# Patient Record
Sex: Female | Born: 1950 | Race: White | Hispanic: No | Smoking: Former smoker
Health system: Southern US, Community
[De-identification: ages and names within clinical notes are randomized; demographics above are authoritative.]

## PROBLEM LIST (undated history)

## (undated) DIAGNOSIS — I10 Essential (primary) hypertension: Secondary | ICD-10-CM

## (undated) DIAGNOSIS — K219 Gastro-esophageal reflux disease without esophagitis: Secondary | ICD-10-CM

## (undated) DIAGNOSIS — F419 Anxiety disorder, unspecified: Secondary | ICD-10-CM

## (undated) DIAGNOSIS — C44722 Squamous cell carcinoma of skin of right lower limb, including hip: Secondary | ICD-10-CM

## (undated) DIAGNOSIS — R51 Headache: Secondary | ICD-10-CM

## (undated) DIAGNOSIS — G43909 Migraine, unspecified, not intractable, without status migrainosus: Secondary | ICD-10-CM

## (undated) DIAGNOSIS — F32A Depression, unspecified: Secondary | ICD-10-CM

## (undated) DIAGNOSIS — R569 Unspecified convulsions: Secondary | ICD-10-CM

## (undated) DIAGNOSIS — K3184 Gastroparesis: Secondary | ICD-10-CM

## (undated) DIAGNOSIS — M1990 Unspecified osteoarthritis, unspecified site: Secondary | ICD-10-CM

## (undated) DIAGNOSIS — J42 Unspecified chronic bronchitis: Secondary | ICD-10-CM

## (undated) DIAGNOSIS — F329 Major depressive disorder, single episode, unspecified: Secondary | ICD-10-CM

## (undated) DIAGNOSIS — E119 Type 2 diabetes mellitus without complications: Secondary | ICD-10-CM

## (undated) DIAGNOSIS — F5104 Psychophysiologic insomnia: Secondary | ICD-10-CM

## (undated) DIAGNOSIS — C4442 Squamous cell carcinoma of skin of scalp and neck: Secondary | ICD-10-CM

## (undated) DIAGNOSIS — R519 Headache, unspecified: Secondary | ICD-10-CM

## (undated) DIAGNOSIS — R000 Tachycardia, unspecified: Secondary | ICD-10-CM

## (undated) HISTORY — PX: TONSILLECTOMY: SUR1361

## (undated) HISTORY — PX: INNER EAR SURGERY: SHX679

## (undated) HISTORY — PX: DILATION AND CURETTAGE OF UTERUS: SHX78

## (undated) HISTORY — PX: FRACTURE SURGERY: SHX138

## (undated) HISTORY — PX: SQUAMOUS CELL CARCINOMA EXCISION: SHX2433

---

## 1988-04-25 HISTORY — PX: REDUCTION MAMMAPLASTY: SUR839

## 1988-04-25 HISTORY — PX: WRIST FRACTURE SURGERY: SHX121

## 2016-02-10 ENCOUNTER — Encounter (HOSPITAL_COMMUNITY): Payer: Self-pay | Admitting: Emergency Medicine

## 2016-02-10 ENCOUNTER — Emergency Department (HOSPITAL_COMMUNITY): Payer: BLUE CROSS/BLUE SHIELD

## 2016-02-10 ENCOUNTER — Observation Stay (HOSPITAL_COMMUNITY)
Admission: EM | Admit: 2016-02-10 | Discharge: 2016-02-11 | Disposition: A | Discharge status: Home / Self Care | Payer: BLUE CROSS/BLUE SHIELD | Attending: Internal Medicine | Admitting: Internal Medicine

## 2016-02-10 ENCOUNTER — Observation Stay (HOSPITAL_COMMUNITY): Payer: BLUE CROSS/BLUE SHIELD

## 2016-02-10 DIAGNOSIS — N390 Urinary tract infection, site not specified: Secondary | ICD-10-CM

## 2016-02-10 DIAGNOSIS — K3184 Gastroparesis: Secondary | ICD-10-CM | POA: Diagnosis not present

## 2016-02-10 DIAGNOSIS — R112 Nausea with vomiting, unspecified: Secondary | ICD-10-CM

## 2016-02-10 DIAGNOSIS — Z79899 Other long term (current) drug therapy: Secondary | ICD-10-CM

## 2016-02-10 DIAGNOSIS — E8729 Other acidosis: Secondary | ICD-10-CM | POA: Insufficient documentation

## 2016-02-10 DIAGNOSIS — E1143 Type 2 diabetes mellitus with diabetic autonomic (poly)neuropathy: Secondary | ICD-10-CM | POA: Diagnosis not present

## 2016-02-10 DIAGNOSIS — I10 Essential (primary) hypertension: Secondary | ICD-10-CM | POA: Diagnosis not present

## 2016-02-10 DIAGNOSIS — Z882 Allergy status to sulfonamides status: Secondary | ICD-10-CM

## 2016-02-10 DIAGNOSIS — Z794 Long term (current) use of insulin: Secondary | ICD-10-CM

## 2016-02-10 DIAGNOSIS — Z8249 Family history of ischemic heart disease and other diseases of the circulatory system: Secondary | ICD-10-CM | POA: Insufficient documentation

## 2016-02-10 DIAGNOSIS — R000 Tachycardia, unspecified: Secondary | ICD-10-CM

## 2016-02-10 DIAGNOSIS — K5909 Other constipation: Secondary | ICD-10-CM

## 2016-02-10 DIAGNOSIS — E872 Acidosis: Secondary | ICD-10-CM

## 2016-02-10 DIAGNOSIS — B961 Klebsiella pneumoniae [K. pneumoniae] as the cause of diseases classified elsewhere: Secondary | ICD-10-CM | POA: Diagnosis not present

## 2016-02-10 DIAGNOSIS — E119 Type 2 diabetes mellitus without complications: Secondary | ICD-10-CM

## 2016-02-10 DIAGNOSIS — F419 Anxiety disorder, unspecified: Secondary | ICD-10-CM

## 2016-02-10 DIAGNOSIS — E785 Hyperlipidemia, unspecified: Secondary | ICD-10-CM

## 2016-02-10 HISTORY — DX: Anxiety disorder, unspecified: F41.9

## 2016-02-10 HISTORY — DX: Major depressive disorder, single episode, unspecified: F32.9

## 2016-02-10 HISTORY — DX: Gastroparesis: K31.84

## 2016-02-10 HISTORY — DX: Tachycardia, unspecified: R00.0

## 2016-02-10 HISTORY — DX: Headache: R51

## 2016-02-10 HISTORY — DX: Essential (primary) hypertension: I10

## 2016-02-10 HISTORY — DX: Unspecified convulsions: R56.9

## 2016-02-10 HISTORY — DX: Psychophysiologic insomnia: F51.04

## 2016-02-10 HISTORY — DX: Unspecified osteoarthritis, unspecified site: M19.90

## 2016-02-10 HISTORY — DX: Gastro-esophageal reflux disease without esophagitis: K21.9

## 2016-02-10 HISTORY — DX: Type 2 diabetes mellitus without complications: E11.9

## 2016-02-10 HISTORY — DX: Migraine, unspecified, not intractable, without status migrainosus: G43.909

## 2016-02-10 HISTORY — DX: Headache, unspecified: R51.9

## 2016-02-10 HISTORY — DX: Squamous cell carcinoma of skin of right lower limb, including hip: C44.722

## 2016-02-10 HISTORY — DX: Squamous cell carcinoma of skin of scalp and neck: C44.42

## 2016-02-10 HISTORY — DX: Depression, unspecified: F32.A

## 2016-02-10 HISTORY — DX: Unspecified chronic bronchitis: J42

## 2016-02-10 LAB — URINALYSIS, ROUTINE W REFLEX MICROSCOPIC
BILIRUBIN URINE: NEGATIVE
Glucose, UA: >1000 mg/dL — AB
NITRITE: NEGATIVE
PROTEIN: NEGATIVE mg/dL
Specific Gravity, Urine: 1.021 (ref 1.005–1.030)
pH: 5 (ref 5.0–8.0)

## 2016-02-10 LAB — GLUCOSE, CAPILLARY: Glucose-Capillary: 171 mg/dL — ABNORMAL HIGH (ref 65–99)

## 2016-02-10 LAB — BASIC METABOLIC PANEL
ANION GAP: 17 — AB (ref 5–15)
BUN: 15 mg/dL (ref 6–20)
CHLORIDE: 108 mmol/L (ref 101–111)
CO2: 15 mmol/L — AB (ref 22–32)
Calcium: 9.3 mg/dL (ref 8.9–10.3)
Creatinine, Ser: 0.86 mg/dL (ref 0.44–1.00)
GFR calc non Af Amer: >60 mL/min (ref >60)
Glucose, Bld: 191 mg/dL — ABNORMAL HIGH (ref 65–99)
Potassium: 4 mmol/L (ref 3.5–5.1)
Sodium: 140 mmol/L (ref 135–145)

## 2016-02-10 LAB — CBG MONITORING, ED: GLUCOSE-CAPILLARY: 104 mg/dL — AB (ref 65–99)

## 2016-02-10 LAB — COMPREHENSIVE METABOLIC PANEL
ALK PHOS: 62 U/L (ref 38–126)
ALT: 30 U/L (ref 14–54)
AST: 45 U/L — AB (ref 15–41)
Albumin: 4.7 g/dL (ref 3.5–5.0)
Anion gap: 17 — ABNORMAL HIGH (ref 5–15)
BILIRUBIN TOTAL: 1.1 mg/dL (ref 0.3–1.2)
BUN: 19 mg/dL (ref 6–20)
CALCIUM: 10.1 mg/dL (ref 8.9–10.3)
CO2: 17 mmol/L — ABNORMAL LOW (ref 22–32)
CREATININE: 0.8 mg/dL (ref 0.44–1.00)
Chloride: 106 mmol/L (ref 101–111)
GFR calc Af Amer: >60 mL/min (ref >60)
Glucose, Bld: 113 mg/dL — ABNORMAL HIGH (ref 65–99)
POTASSIUM: 4 mmol/L (ref 3.5–5.1)
Sodium: 140 mmol/L (ref 135–145)
TOTAL PROTEIN: 7.4 g/dL (ref 6.5–8.1)

## 2016-02-10 LAB — CBC
HCT: 42.5 % (ref 36.0–46.0)
Hemoglobin: 14.4 g/dL (ref 12.0–15.0)
MCH: 30.5 pg (ref 26.0–34.0)
MCHC: 33.9 g/dL (ref 30.0–36.0)
MCV: 90 fL (ref 78.0–100.0)
PLATELETS: 323 10*3/uL (ref 150–400)
RBC: 4.72 MIL/uL (ref 3.87–5.11)
RDW: 14.2 % (ref 11.5–15.5)
WBC: 18.4 10*3/uL — AB (ref 4.0–10.5)

## 2016-02-10 LAB — LIPASE, BLOOD: Lipase: 17 U/L (ref 11–51)

## 2016-02-10 LAB — URINE MICROSCOPIC-ADD ON: RBC / HPF: NONE SEEN RBC/hpf (ref 0–5)

## 2016-02-10 LAB — I-STAT CG4 LACTIC ACID, ED: LACTIC ACID, VENOUS: 0.99 mmol/L (ref 0.5–1.9)

## 2016-02-10 LAB — I-STAT TROPONIN, ED: Troponin i, poc: 0.01 ng/mL (ref 0.00–0.08)

## 2016-02-10 MED ORDER — METOPROLOL TARTRATE 5 MG/5ML IV SOLN: 5.0000 mg | Freq: Four times a day (QID) | INTRAVENOUS | Status: DC | PRN

## 2016-02-10 MED ORDER — ONDANSETRON HCL 4 MG/2ML IJ SOLN
4.0000 mg | Freq: Once | INTRAMUSCULAR | Status: AC | PRN
  Administered 2016-02-10: 4 mg via INTRAVENOUS
  Filled 2016-02-10: qty 2

## 2016-02-10 MED ORDER — METOCLOPRAMIDE HCL 5 MG/ML IJ SOLN
10.0000 mg | Freq: Once | INTRAMUSCULAR | Status: AC
  Administered 2016-02-10: 10 mg via INTRAVENOUS
  Filled 2016-02-10: qty 2

## 2016-02-10 MED ORDER — PROMETHAZINE HCL 25 MG/ML IJ SOLN
12.5000 mg | Freq: Once | INTRAMUSCULAR | Status: AC
Start: 2016-02-10 — End: 2016-02-10
  Administered 2016-02-10: 12.5 mg via INTRAVENOUS
  Filled 2016-02-10: qty 1

## 2016-02-10 MED ORDER — ENOXAPARIN SODIUM 40 MG/0.4ML ~~LOC~~ SOLN
40.0000 mg | SUBCUTANEOUS | Status: DC
  Administered 2016-02-10: 40 mg via SUBCUTANEOUS
  Filled 2016-02-10: qty 0.4

## 2016-02-10 MED ORDER — INSULIN ASPART 100 UNIT/ML ~~LOC~~ SOLN: 0.0000 [IU] | Freq: Every day | SUBCUTANEOUS | Status: DC

## 2016-02-10 MED ORDER — PROCHLORPERAZINE EDISYLATE 5 MG/ML IJ SOLN
5.0000 mg | Freq: Once | INTRAMUSCULAR | Status: AC
  Administered 2016-02-10: 5 mg via INTRAVENOUS
  Filled 2016-02-10: qty 2

## 2016-02-10 MED ORDER — SODIUM CHLORIDE 0.9 % IV BOLUS (SEPSIS)
500.0000 mL | Freq: Once | INTRAVENOUS | Status: AC
  Administered 2016-02-10: 500 mL via INTRAVENOUS

## 2016-02-10 MED ORDER — FAMOTIDINE IN NACL 20-0.9 MG/50ML-% IV SOLN
20.0000 mg | INTRAVENOUS | Status: DC
  Administered 2016-02-10: 20 mg via INTRAVENOUS
  Filled 2016-02-10 (×2): qty 50

## 2016-02-10 MED ORDER — INSULIN ASPART 100 UNIT/ML ~~LOC~~ SOLN
0.0000 [IU] | Freq: Three times a day (TID) | SUBCUTANEOUS | Status: DC
  Administered 2016-02-11: 2 [IU] via SUBCUTANEOUS
  Administered 2016-02-11: 1 [IU] via SUBCUTANEOUS

## 2016-02-10 MED ORDER — DEXTROSE 5 % IV SOLN
1.0000 g | INTRAVENOUS | Status: DC
  Administered 2016-02-10 – 2016-02-11 (×2): 1 g via INTRAVENOUS
  Filled 2016-02-10 (×2): qty 10

## 2016-02-10 MED ORDER — SODIUM CHLORIDE 0.9 % IV SOLN
INTRAVENOUS | Status: AC
  Administered 2016-02-10 – 2016-02-11 (×2): via INTRAVENOUS

## 2016-02-10 MED ORDER — SODIUM CHLORIDE 0.9% FLUSH
3.0000 mL | Freq: Two times a day (BID) | INTRAVENOUS | Status: DC
  Administered 2016-02-10 – 2016-02-11 (×2): 3 mL via INTRAVENOUS

## 2016-02-10 MED ORDER — METOCLOPRAMIDE HCL 5 MG/ML IJ SOLN
5.0000 mg | Freq: Four times a day (QID) | INTRAMUSCULAR | Status: DC | PRN
  Administered 2016-02-11: 5 mg via INTRAVENOUS
  Filled 2016-02-10: qty 2

## 2016-02-10 MED ORDER — MORPHINE SULFATE (PF) 2 MG/ML IV SOLN
2.0000 mg | Freq: Once | INTRAVENOUS | Status: AC
  Administered 2016-02-10: 2 mg via INTRAVENOUS
  Filled 2016-02-10: qty 1

## 2016-02-10 MED ORDER — GI COCKTAIL ~~LOC~~
30.0000 mL | Freq: Two times a day (BID) | ORAL | Status: DC | PRN
  Filled 2016-02-10: qty 30

## 2016-02-10 MED ORDER — DEXTROSE-NACL 5-0.45 % IV SOLN
INTRAVENOUS | Status: DC
  Administered 2016-02-10: 16:00:00 via INTRAVENOUS

## 2016-02-10 NOTE — ED Notes (Signed)
Informed pt of need for urine sample. Pt advised that she would call us when she was ready to give one.

## 2016-02-10 NOTE — ED Triage Notes (Signed)
Patient complains of epigastric pain with vomiting since last pm. States that she thinks related to her gastroparesis. Patient appears anxious on arrival and states that she did drink wine last pm at concert.  Alert and oriented

## 2016-02-10 NOTE — ED Notes (Signed)
Attempted report x1. 

## 2016-02-10 NOTE — Progress Notes (Signed)
Brandy Palmer [id number] Admission Data: 02/10/2016 6:54 PM Attending Provider: Sid Falcon, MD  PCP:No primary care provider on file. Consults/ Treatment Team:   Brandy Palmer is a 65 y.o. female patient admitted from ED awake, alert  & orientated  X 3,  Full Code, VSS - Blood pressure 138/69, pulse (!) 124, temperature 98 F (36.7 C), temperature source Oral, resp. rate 19, SpO2 96 %.,   no c/o shortness of breath, no c/o chest pain, no distress noted. Tele # 10  placed and pt is currently running:normal sinus rhythm.   IV site WDL:  forearm right, condition patent and no redness with a transparent dsg that's clean dry and intact.  Allergies:   Allergies  Allergen Reactions  . Bactrim [Sulfamethoxazole-Trimethoprim] Nausea And Vomiting  . Sulfa Antibiotics Nausea And Vomiting     Past Medical History:  Diagnosis Date  . Diabetes mellitus without complication ([place])   . Gastroparesis   . Hypertension   . Seizures ([place])     History:  obtained from the patient. Tobacco/alcohol: denied social drinker  Pt orientation to unit, room and routine. Information packet given to patient/family and safety video watched.  Admission INP armband ID verified with patient/family, and in place. SR up x 2, fall risk assessment complete with Patient and family verbalizing understanding of risks associated with falls. Pt verbalizes an understanding of how to use the call bell and to call for help before getting out of bed.  Skin, clean-dry- intact without evidence of bruising, or skin tears.   No evidence of skin break down noted on exam. no rashes, no ecchymoses, no petechiae    Will cont to monitor and assist as needed.  Brandy Varone Margaretha Sheffield, RN 02/10/2016 6:54 PM

## 2016-02-10 NOTE — H&P (Signed)
Date: 02/10/2016               Patient Name:  Brandy Palmer MRN: [id number]  DOB: December 13, 1950 Age / Sex: 65 y.o., female   PCP: No primary care provider on file.         Medical Service: Internal Medicine Teaching Service         Attending Physician: Dr. Sid Falcon, MD    First Contact: Dr. Reesa Chew Pager: [phone]  Second Contact: Dr. Juleen China Pager: [phone]       After Hours (After 5p/  First Contact Pager: [phone]  weekends / holidays): Second Contact Pager: [phone]   Chief Complaint: Nausea vomiting with abdominal pain.  History of Present Illness: Brandy Palmer,65 y.o lady with PMHOf diabetes complicated with gastroparesis, hypertension and tachycardia, came to the ED with complaints of nausea or vomiting and abdominal pain started around 4 AM this morning.  Patient states that she was visiting from [place], they were about to go back home today. She states that she started getting this feeling of tightness accompanied with nausea and vomiting early this morning. She states that she was drinking yesterday evening, when she went to bed, she was little uncomfortable. Later on she developed tightness in her epigastrium, and then developed nausea and vomiting. She is unable to keep any by mouth intake. She states that she do get these type of episodes because of her history of gastroparesis. She was attributing that alcohol intake yesterday might be a contributory factor. Her epigastric pain is more like a tightness with some intermittent cramps, non radiating.No exacerbating or relieving factors. She had numerous episodes of emesis of yellow fluid, denies any hematemesis or coffee ground vomitus. She do endorse some subjective fever and chills. She also complained of palpitations, she had an history of being tachycardic and has been worked up in the past by cardiologist. Her rate is controlled on metoprolol. She denies any chest pain, orthopnea or PND. She is an history of getting  seizures with increase in vomiting. She was recently diagnosed with a urinary tract infection and did not get antibiotics for it. She reports dysuria and foul smelling urine.  She also has an history of chronic constipation, states that her last bowel movement was last week.  Meds:  Current Meds  Medication Sig  . ALPRAZolam (XANAX) 0.5 MG tablet Take 0.5-1 mg by mouth 2 (two) times daily as needed for anxiety.  . Aspirin-Salicylamide-Caffeine (BC HEADACHE POWDER PO) Take 1 packet by mouth daily as needed (headache).  . canagliflozin (INVOKANA) 100 MG TABS tablet Take 100 mg by mouth daily before breakfast.  . FLUoxetine (PROZAC) 20 MG capsule Take 20 mg by mouth daily.  . Insulin Glargine (LANTUS SOLOSTAR) 100 UNIT/ML Solostar Pen Inject 35-40 Units into the skin daily at 10 pm. Per sliding scale  . linagliptin (TRADJENTA) 5 MG TABS tablet Take 5 mg by mouth daily.  Marland Kitchen omeprazole (PRILOSEC) 20 MG capsule Take 20 mg by mouth daily.  . promethazine (PHENERGAN) 12.5 MG tablet Take 12.5 mg by mouth every 8 (eight) hours as needed for nausea or vomiting.  . valACYclovir (VALTREX) 500 MG tablet Take 500 mg by mouth daily.  Marland Kitchen zolpidem (AMBIEN) 10 MG tablet Take 5-10 mg by mouth at bedtime as needed for sleep.     Allergies: Allergies as of 02/10/2016 - Review Complete 02/10/2016  Allergen Reaction Noted  . Bactrim [sulfamethoxazole-trimethoprim] Nausea And Vomiting 02/10/2016  . Sulfa antibiotics Nausea And  Vomiting 02/10/2016   Past Medical History:  Diagnosis Date  . Diabetes mellitus without complication ([place])   . Gastroparesis   . Hypertension   . Seizures ([place])     Family History: One brother has hypertension and the other one has diabetes.  Social History: Nonsmoker, occasionally using alcohol. Denies any illicit drug use.  Review of Systems: A complete ROS was negative except as per HPI.   Physical Exam: Blood pressure 135/68, pulse (!) 121, temperature 98.2 F (36.8 C),  temperature source Oral, resp. rate 18, SpO2 98 %.  Vitals:   02/10/16 1630 02/10/16 1700 02/10/16 1730 02/10/16 1800  BP: 143/67 138/57 142/69 135/68  Pulse: (!) 125 (!) 123 (!) 123 (!) 121  Resp: 14 19 18 18   Temp:      TempSrc:      SpO2: 96% 96% 96% 98%   General: Vital signs reviewed.  Patient is well-developed and well-nourished, in no acute distress and cooperative with exam.  Head: Normocephalic and atraumatic. Eyes: EOMI, conjunctivae normal, no scleral icterus.  Neck: Supple, trachea midline, normal ROM, no JVD, masses, thyromegaly, or carotid bruit present.  Cardiovascular: Tachycardia,no murmurs, gallops, or rubs. Pulmonary/Chest: Clear to auscultation bilaterally, no wheezes, rales, or rhonchi. Abdominal: Soft, Epigastric tenderness, non-distended, BS +, no masses, organomegaly, or guarding present.  Extremities: No lower extremity edema bilaterally,  pulses symmetric and intact bilaterally. No cyanosis or clubbing. Neurological: A&O x3, Strength is normal and symmetric bilaterally, cranial nerve II-XII are grossly intact, no focal motor deficit, sensory intact to light touch bilaterally.  Skin: Warm, dry and intact. No rashes or erythema.  Labs. CBC Latest Ref Rng & Units 02/10/2016  WBC 4.0 - 10.5 K/uL 18.4(H)  Hemoglobin 12.0 - 15.0 g/dL 14.4  Hematocrit 36.0 - 46.0 % 42.5  Platelets 150 - 400 K/uL 323   CMP Latest Ref Rng & Units 02/10/2016  Glucose 65 - 99 mg/dL 113(H)  BUN 6 - 20 mg/dL 19  Creatinine 0.44 - 1.00 mg/dL 0.80  Sodium 135 - 145 mmol/L 140  Potassium 3.5 - 5.1 mmol/L 4.0  Chloride 101 - 111 mmol/L 106  CO2 22 - 32 mmol/L 17(L)  Calcium 8.9 - 10.3 mg/dL 10.1  Total Protein 6.5 - 8.1 g/dL 7.4  Total Bilirubin 0.3 - 1.2 mg/dL 1.1  Alkaline Phos 38 - 126 U/L 62  AST 15 - 41 U/L 45(H)  ALT 14 - 54 U/L 30   Urinalysis    Component Value Date/Time   COLORURINE AMBER (A) 02/10/2016 1300   APPEARANCEUR CLEAR 02/10/2016 1300   LABSPEC 1.021  02/10/2016 1300   PHURINE 5.0 02/10/2016 1300   GLUCOSEU >1000 (A) 02/10/2016 1300   HGBUR SMALL (A) 02/10/2016 1300   BILIRUBINUR NEGATIVE 02/10/2016 1300   KETONESUR >80 (A) 02/10/2016 1300   PROTEINUR NEGATIVE 02/10/2016 1300   NITRITE NEGATIVE 02/10/2016 1300   LEUKOCYTESUR TRACE (A) 02/10/2016 1300   Squamous Epithelial / LPF NONE SEEN 0-5    WBC, UA 0 - 5 WBC/hpf 6-30   RBC / HPF 0 - 5 RBC/hpf NONE SEEN   Bacteria, UA NONE SEEN RARE    Casts NEGATIVE HYALINE CASTS     Urine culture and sensitivity from October 11.  Klebsiella pneumoniae (A) Comment:  Greater than 100,000 colony forming units per mL Cefazolin <=4 ug/mL Cefazolin with an MIC <=16 predicts susceptibility to the oral agents cefaclor, cefdinir, cefpodoxime, cefprozil, cefuroxime, cephalexin, and loracarbef when used for therapy of uncomplicated urinary tract infections due to  E. coli, Klebsiella pneumoniae, and Proteus mirabilis.    Antimicrobial Susceptibility Comment Comment:  ** S = Susceptible; I = Intermediate; R = Resistant **  P = Positive; N = Negative MICS are expressed in micrograms per mL  Antibiotic RSLT#1RSLT#2RSLT#3RSLT#4 Amoxicillin/Clavulanic AcidS Ampicillin R Cefepime S CeftriaxoneS Cefuroxime S CephalothinS CiprofloxacinS ErtapenemS Gentamicin S Imipenem S Levofloxacin S Nitrofurantoin R Piperacillin R Tetracycline S Tobramycin S Trimethoprim/Sulfa S        EKG: Sinus tachycardia Atrial premature complex Borderline right axis deviation Borderline prolonged QT interval  CXR:  FINDINGS: The heart size and mediastinal  contours are normal. The lungs are clear. There is no pleural effusion or pneumothorax. No acute osseous findings are identified. EKG snaps overlie the chest bilaterally. There are mild thoracic spine degenerative changes.  IMPRESSION: No active cardiopulmonary process.   Assessment & Plan by Problem: Ms. Pintor,65 y.o lady with PMHOf diabetes complicated with gastroparesis, hypertension and tachycardia, came to the ED with complaints of nausea or vomiting and abdominal pain.  Nausea vomiting with epigastric pain. She has an history of type 2 diabetes with gastroparesis diagnosed about a year ago with gastric emptying studies. Differential include Exacerbation of her existing gastroparesis. She told us that she do get this type of episode of vomiting with epigastric discomfort because of her gastroparesis. In the ED she was found to have metabolic acidosis with anion gap of 17 and bicarbonate of 17. Her urine was positive with ketones. Lactic acid was normal. Her blood sugar was 113, which makes DKA unlikely. Most probably starvation ketosis. Acute gastritis. Can be another possibility, as her symptoms started after drinking wine more than her usual amount. -Admit to MedSurg for observation. -Dextrose 5% with normal saline infusion at 125 mL per hour. -GI cocktail -Pepcid 20 mg IV daily -Reglan 10 mg IV every 6H when necessary. -Abdominal x-rays to rule out any possible obstruction, as her last bowel movement was 1 week ago. UTI. She has a recent diagnosis of UTI, with a culture sensitivity report on October 11. She was given an antibiotic but she never picked it up from pharmacy. Her UA look dirty. Culture were sent. She was started on Rocephin in the ED, her previous culture sensitivity shows Klebsiella pneumonia sensitive to ceftriaxone. She can be started on either Cipro or Augmentin tomorrow.  DM. She has well-controlled diabetes, her last A1c was 6.4. She was found on Invokana and  Lantus at home. -SSI.  Hyperlipidemia. She is on simvastatin 20 mg daily and fenofibrate. We are holding her home medicines for hyperlipidemia because of her continuous nausea and vomiting.  Hypertension. She was on lisinopril 10 mg daily. -We will hold her lisinopril and reassess her blood pressure in the morning.  Sinus Tachycardia. She had this history of tachycardia which was been worked up in the past by cardiologist. She is on Toprol 25 mg daily for rate control. -Metoprolol 5 mg IV every 6 hourly when necessary for the heart rate more than 130.  CODE STATUS. Full Diet. Nothing by mouth DVT prophylaxis. Lovenox  Dispo: Admit patient to Observation with expected length of stay less than 2 midnights.  Signed: [name], MD 02/10/2016, 6:23 PM  Pager: [phone]

## 2016-02-10 NOTE — ED Notes (Signed)
Pt complains of having chills.

## 2016-02-10 NOTE — ED Provider Notes (Signed)
[facility] DEPT Provider Note   CSN: [id number] Arrival date & time: 02/10/16  1027     History   Chief Complaint Chief Complaint  Patient presents with  . Abdominal Pain  . Emesis    HPI Brandy Palmer is a 65 year old woman with DM2, gastroparesis, HTN, seizures.  HPI She presents with abdominal pain, nausea, vomiting. Her symptoms started last night. Her abdominal pain is in the epigastric region. It is cramping in nature and comes and goes. Does not radiate anywhere. She has associated chest pain. No exacerbating or relieving factors. She has had numerous episodes of emesis that she says is yellow fluid. No hematemesis or coffee ground emesis. This is similar to previous episodes of gastroparesis flares. Only thing that she has had recently that was unusual for her was a glass of wine. She did not take her insulin last night since she had the wine. She reports subjective fevers and chills. Her last BM was a week ago but she has been passing flatus - last this morning.   She is visiting from [place]. She reports her gastroparesis was diagnosed with a gastric emptying study years ago. She has also had diabetes since her 108s.  She was recently diagnosed with a urinary tract infection and did not get antibiotics for it. She reports dysuria and foul smelling urine.   Past Medical History:  Diagnosis Date  . Diabetes mellitus without complication ([place])   . Gastroparesis   . Hypertension   . Seizures ([place])     History reviewed. No pertinent surgical history.  OB History    No data available       Home Medications    Prior to Admission medications   Medication Sig Start Date End Date Taking? Authorizing Provider  ALPRAZolam Duanne Moron) 0.5 MG tablet Take 0.5-1 mg by mouth 2 (two) times daily as needed for anxiety.   Yes Historical Provider, MD  Aspirin-Salicylamide-Caffeine (BC HEADACHE POWDER PO) Take 1 packet by mouth daily as needed (headache).   Yes Historical  Provider, MD  canagliflozin (INVOKANA) 100 MG TABS tablet Take 100 mg by mouth daily before breakfast.   Yes Historical Provider, MD  FLUoxetine (PROZAC) 20 MG capsule Take 20 mg by mouth daily.   Yes Historical Provider, MD  Insulin Glargine (LANTUS SOLOSTAR) 100 UNIT/ML Solostar Pen Inject 35-40 Units into the skin daily at 10 pm. Per sliding scale   Yes Historical Provider, MD  linagliptin (TRADJENTA) 5 MG TABS tablet Take 5 mg by mouth daily.   Yes Historical Provider, MD  omeprazole (PRILOSEC) 20 MG capsule Take 20 mg by mouth daily.   Yes Historical Provider, MD  promethazine (PHENERGAN) 12.5 MG tablet Take 12.5 mg by mouth every 8 (eight) hours as needed for nausea or vomiting.   Yes Historical Provider, MD  valACYclovir (VALTREX) 500 MG tablet Take 500 mg by mouth daily.   Yes Historical Provider, MD  zolpidem (AMBIEN) 10 MG tablet Take 5-10 mg by mouth at bedtime as needed for sleep.   Yes Historical Provider, MD    Family History No family history on file.  Social History Social History  Substance Use Topics  . Smoking status: Never Smoker  . Smokeless tobacco: Never Used  . Alcohol use Yes     Allergies   Bactrim [sulfamethoxazole-trimethoprim] and Sulfa antibiotics   Review of Systems Review of Systems Constitutional: +subjective fevers/chills Eyes: no vision changes Ears, nose, mouth, throat, and face: no cough Respiratory: no shortness of breath Cardiovascular: +  chest pain Gastrointestinal: +nausea/vomiting, +abdominal pain, no constipation, no diarrhea Genitourinary: +dysuria, no hematuria Integument: no rash Hematologic/lymphatic: no bleeding/bruising, no edema Musculoskeletal: no arthralgias, no myalgias Neurological: no paresthesias, no weakness   Physical Exam Updated Vital Signs BP (!) 118/47   Pulse 118   Temp 98.2 F (36.8 C) (Oral)   Resp 19   SpO2 96%   Physical Exam General Apperance: Mild distress Head: Normocephalic, atraumatic Eyes:  PERRL, EOMI, anicteric sclera Ears: Normal external ear canal Nose: Nares normal, septum midline, mucosa normal Throat: Lips, mucosa and tongue normal  Neck: Supple, trachea midline Back: No tenderness or bony abnormality  Lungs: Clear to auscultation bilaterally. No wheezes, rhonchi or rales. Breathing comfortably Chest Wall: Nontender, no deformity Heart: Tachycardic rate and regular rhythm, no murmur/rub/gallop Abdomen: Soft, tender to palpation in epigastric region, nondistended, no rebound/guarding Extremities: Normal, atraumatic, warm and well perfused, no edema Pulses: 2+ throughout Skin: No rashes or lesions Neurologic: Alert and oriented x 3. CNII-XII intact. Normal strength and sensation  ED Treatments / Results  Labs (all labs ordered are listed, but only abnormal results are displayed) Labs Reviewed  COMPREHENSIVE METABOLIC PANEL - Abnormal; Notable for the following:       Result Value   CO2 17 (*)    Glucose, Bld 113 (*)    AST 45 (*)    Anion gap 17 (*)    All other components within normal limits  CBC - Abnormal; Notable for the following:    WBC 18.4 (*)    All other components within normal limits  URINALYSIS, ROUTINE W REFLEX MICROSCOPIC (NOT AT [facility]) - Abnormal; Notable for the following:    Color, Urine AMBER (*)    Glucose, UA >1000 (*)    Hgb urine dipstick SMALL (*)    Ketones, ur >80 (*)    Leukocytes, UA TRACE (*)    All other components within normal limits  URINE MICROSCOPIC-ADD ON - Abnormal; Notable for the following:    Squamous Epithelial / LPF 0-5 (*)    Bacteria, UA RARE (*)    Casts HYALINE CASTS (*)    All other components within normal limits  CBG MONITORING, ED - Abnormal; Notable for the following:    Glucose-Capillary 104 (*)    All other components within normal limits  URINE CULTURE  LIPASE, BLOOD  I-STAT TROPOININ, ED  I-STAT CG4 LACTIC ACID, ED    EKG  EKG Interpretation  Date/Time:  Wednesday February 10 2016 10:57:16  EDT Ventricular Rate:  99 PR Interval:    QRS Duration: 91 QT Interval:  385 QTC Calculation: 495 R Axis:   87 Text Interpretation:  Sinus tachycardia Atrial premature complex Borderline right axis deviation Borderline prolonged QT interval No previous tracing Confirmed by Maryan Rued  MD, Loree Fee ([id number]) on 02/10/2016 11:03:17 AM Also confirmed by Maryan Rued  MD, Loree Fee ([id number]), editor [place], Joelene Millin [phone])  on 02/10/2016 1:50:07 PM       Radiology Dg Chest 2 View  Result Date: 02/10/2016 CLINICAL DATA:  Chest tightness and nausea for 4 days. EXAM: CHEST  2 VIEW COMPARISON:  None. FINDINGS: The heart size and mediastinal contours are normal. The lungs are clear. There is no pleural effusion or pneumothorax. No acute osseous findings are identified. EKG snaps overlie the chest bilaterally. There are mild thoracic spine degenerative changes. IMPRESSION: No active cardiopulmonary process. Electronically Signed   By: Richardean Sale M.D.   On: 02/10/2016 13:19    Procedures   Medications Ordered in  ED Medications  dextrose 5 %-0.45 % sodium chloride infusion (not administered)  cefTRIAXone (ROCEPHIN) 1 g in dextrose 5 % 50 mL IVPB (1 g Intravenous New Bag/Given 02/10/16 1443)  ondansetron (ZOFRAN) injection 4 mg (4 mg Intravenous Given 02/10/16 1048)  sodium chloride 0.9 % bolus 500 mL (0 mLs Intravenous Stopped 02/10/16 1150)  metoCLOPramide (REGLAN) injection 10 mg (10 mg Intravenous Given 02/10/16 1147)  morphine 2 MG/ML injection 2 mg (2 mg Intravenous Given 02/10/16 1149)  sodium chloride 0.9 % bolus 500 mL (500 mLs Intravenous New Bag/Given 02/10/16 1156)    Initial Impression / Assessment and Plan / ED Course  I have reviewed the triage vital signs and the nursing notes.  Pertinent labs & imaging results that were available during my care of the patient were reviewed by me and considered in my medical decision making (see chart for details).  Clinical Course   11:00am  evaluated pt 14:00 mild improvement in symptoms  Final Clinical Impressions(s) / ED Diagnoses   Final diagnoses:  Gastroparesis  Starvation ketoacidosis  Emesis and epigastric pain since yesterday evening. She has a history of DM2 with gastroparesis. Metabolic acidosis with anion gap - bicarb 17 and anion gap of 17. Lactic acid normal. Troponin POC 0.01 and EKG without acute ischemic changes. Nonspecific T wave changes. QTc 495. Her WBC is 18.4. CXR with no acute abnormality. UA with ketones. She has trace leukocytes, 6-30 WBC, rate bacteria, 0-5 squamous epithelial cells. Unlikely to be in DKA given her blood glucose of 113. Her metabolic acidosis with anion gap and ketones in the urine are probably from a starvation ketosis secondary to gastroparesis flare. Possible UTI. Added on urine culture. Will cover with ceftriaxone for now. Admit for observation to med surg. Discussed with IMTS.  New Prescriptions New Prescriptions   No medications on file     Milagros Loll, MD 02/10/16 1517    Blanchie Dessert, MD 02/11/16 2221

## 2016-02-11 DIAGNOSIS — N390 Urinary tract infection, site not specified: Secondary | ICD-10-CM | POA: Diagnosis not present

## 2016-02-11 DIAGNOSIS — R112 Nausea with vomiting, unspecified: Secondary | ICD-10-CM

## 2016-02-11 DIAGNOSIS — B961 Klebsiella pneumoniae [K. pneumoniae] as the cause of diseases classified elsewhere: Secondary | ICD-10-CM | POA: Diagnosis not present

## 2016-02-11 DIAGNOSIS — K3184 Gastroparesis: Secondary | ICD-10-CM | POA: Diagnosis not present

## 2016-02-11 DIAGNOSIS — E1143 Type 2 diabetes mellitus with diabetic autonomic (poly)neuropathy: Secondary | ICD-10-CM | POA: Diagnosis not present

## 2016-02-11 LAB — CBC
HEMATOCRIT: 36.6 % (ref 36.0–46.0)
HEMOGLOBIN: 12 g/dL (ref 12.0–15.0)
MCH: 30.3 pg (ref 26.0–34.0)
MCHC: 32.8 g/dL (ref 30.0–36.0)
MCV: 92.4 fL (ref 78.0–100.0)
Platelets: 273 10*3/uL (ref 150–400)
RBC: 3.96 MIL/uL (ref 3.87–5.11)
RDW: 15 % (ref 11.5–15.5)
WBC: 14.7 10*3/uL — ABNORMAL HIGH (ref 4.0–10.5)

## 2016-02-11 LAB — BASIC METABOLIC PANEL
ANION GAP: 14 (ref 5–15)
ANION GAP: 11 (ref 5–15)
BUN: 18 mg/dL (ref 6–20)
BUN: 16 mg/dL (ref 6–20)
CHLORIDE: 111 mmol/L (ref 101–111)
CO2: 14 mmol/L — AB (ref 22–32)
CO2: 16 mmol/L — ABNORMAL LOW (ref 22–32)
Calcium: 8.9 mg/dL (ref 8.9–10.3)
Calcium: 9 mg/dL (ref 8.9–10.3)
Chloride: 111 mmol/L (ref 101–111)
Creatinine, Ser: 0.82 mg/dL (ref 0.44–1.00)
Creatinine, Ser: 0.79 mg/dL (ref 0.44–1.00)
GFR calc Af Amer: >60 mL/min (ref >60)
GFR calc Af Amer: >60 mL/min (ref >60)
GLUCOSE: 134 mg/dL — AB (ref 65–99)
GLUCOSE: 153 mg/dL — AB (ref 65–99)
POTASSIUM: 4 mmol/L (ref 3.5–5.1)
POTASSIUM: 4.1 mmol/L (ref 3.5–5.1)
Sodium: 139 mmol/L (ref 135–145)
Sodium: 138 mmol/L (ref 135–145)

## 2016-02-11 LAB — GLUCOSE, CAPILLARY
GLUCOSE-CAPILLARY: 137 mg/dL — AB (ref 65–99)
GLUCOSE-CAPILLARY: 179 mg/dL — AB (ref 65–99)

## 2016-02-11 LAB — HIV ANTIBODY (ROUTINE TESTING W REFLEX): HIV Screen 4th Generation wRfx: NONREACTIVE

## 2016-02-11 MED ORDER — AMOXICILLIN-POT CLAVULANATE 500-125 MG PO TABS: 500.0000 mg | ORAL_TABLET | Freq: Two times a day (BID) | ORAL | 0 refills | Status: AC

## 2016-02-11 MED ORDER — PROMETHAZINE HCL 25 MG/ML IJ SOLN: 12.5000 mg | INTRAMUSCULAR | Status: DC | PRN

## 2016-02-11 MED ORDER — PROMETHAZINE HCL 25 MG/ML IJ SOLN: 12.5000 mg | Freq: Four times a day (QID) | INTRAMUSCULAR | Status: DC | PRN

## 2016-02-11 MED ORDER — AMOXICILLIN-POT CLAVULANATE 500-125 MG PO TABS: 500.0000 mg | ORAL_TABLET | Freq: Two times a day (BID) | ORAL | 0 refills | Status: DC

## 2016-02-11 MED ORDER — METOCLOPRAMIDE HCL 5 MG/ML IJ SOLN
5.0000 mg | Freq: Four times a day (QID) | INTRAMUSCULAR | Status: DC
  Administered 2016-02-11: 5 mg via INTRAVENOUS
  Filled 2016-02-11: qty 2

## 2016-02-11 MED ORDER — CIPROFLOXACIN-CIPROFLOX HCL ER 500 MG PO TB24: 500.0000 mg | ORAL_TABLET | Freq: Every day | ORAL | 0 refills | Status: DC

## 2016-02-11 MED ORDER — SODIUM CHLORIDE 0.9 % IV SOLN
INTRAVENOUS | Status: DC
  Administered 2016-02-11 (×2): via INTRAVENOUS

## 2016-02-11 NOTE — Care Management Note (Signed)
Case Management Note  Patient Details  Name: KEYANNI AVEY MRN: [id number] Date of Birth: [birth year]  Subjective/Objective:                 Patient from home in [place]. In town for [employer]. In obs for gastroparesis patient states is related to DM. Patient is independent, retired, and drives. Patient denies need for CM resources.  PCP Dr Henrietta Dine   Action/Plan:  Anticipate DC to home self care as symptoms resolve.  Expected Discharge Date:                  Expected Discharge Plan:  Home/Self Care  In-House Referral:  NA  Discharge planning Services  CM Consult  Post Acute Care Choice:  NA Choice offered to:  NA  DME Arranged:  N/A DME Agency:  NA  HH Arranged:  NA HH Agency:  NA  Status of Service:  Completed, signed off  If discussed at [place] of Stay Meetings, dates discussed:    Additional Comments:  Carles Collet, RN 02/11/2016, 2:26 PM

## 2016-02-11 NOTE — Discharge Summary (Signed)
Name: Brandy Palmer MRN: [id number] DOB: Sep 25, 1950 65 y.o. PCP: Pcp Not In System  Date of Admission: 02/10/2016 10:40 AM Date of Discharge: 02/12/2016 Attending Physician: No att. providers found  Discharge Diagnosis: 1. Gastroparesis 2. UTI 3. Diabetes 4. Hypertension 5. Hyperlipidemia 6. Sinus tachycardia  Discharge Medications:   Medication List    STOP taking these medications   BC HEADACHE POWDER PO     TAKE these medications   ALPRAZolam 0.5 MG tablet Commonly known as:  XANAX Take 0.5-1 mg by mouth 2 (two) times daily as needed for anxiety.   amoxicillin-clavulanate 500-125 MG tablet Commonly known as:  AUGMENTIN Take 1 tablet (500 mg total) by mouth 2 (two) times daily. Stop date 10/22   FLUoxetine 20 MG capsule Commonly known as:  PROZAC Take 20 mg by mouth daily.   INVOKANA 100 MG Tabs tablet Generic drug:  canagliflozin Take 100 mg by mouth daily before breakfast.   LANTUS SOLOSTAR 100 UNIT/ML Solostar Pen Generic drug:  Insulin Glargine Inject 35-40 Units into the skin daily at 10 pm. Per sliding scale   linagliptin 5 MG Tabs tablet Commonly known as:  TRADJENTA Take 5 mg by mouth daily.   omeprazole 20 MG capsule Commonly known as:  PRILOSEC Take 20 mg by mouth daily.   promethazine 12.5 MG tablet Commonly known as:  PHENERGAN Take 12.5 mg by mouth every 8 (eight) hours as needed for nausea or vomiting.   valACYclovir 500 MG tablet Commonly known as:  VALTREX Take 500 mg by mouth daily.   zolpidem 10 MG tablet Commonly known as:  AMBIEN Take 5-10 mg by mouth at bedtime as needed for sleep.       Disposition and follow-up:   BrandyBrandy Palmer was discharged from [facility] in Stable condition.  At the hospital follow up visit please address:  1.  Her need to cut back on her alcohol intake, and education about her gastroparesis.  2.  Labs / imaging needed at time of follow-up: UA  3.  Pending labs/ test  needing follow-up: None  Follow-up Appointments:  please call your PCP to make a follow-up appointment.  Hospital Course by problem list:  Brandy Palmer,65 y.o lady with PMHOf diabetes complicated with gastroparesis, hypertension and tachycardia, came to the ED with complaints of nausea or vomiting and abdominal pain after having an excessive intake of alcoholic night before. 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 It was most probably an other episode of her existing gastroparesis exacerbation, but her excessive use of alcohol might be playing a role. She was treated with GI cocktail, Pepcid 20 mg IV daily. She was given 1 dose of Zofran, Reglan 10 mg every 6 hourly when necessary, Compazine 1 dose, and Phenergan one dose for her nausea and vomiting. Her nausea and vomiting resolved next day. She was discharged home, once she started tolerating advancing diet. We instructed her to avoid Zofran and Phenergan if possible, as she has borderline QTC.  Metabolic acidosis. In the ED she was found to have metabolic acidosis with anion gap of 17 and bicarbonate of 17. Her urine was positive with ketones. Lactic acid was normal. Her blood sugar was 113, either she was having diabetic ketoacidosis with normal blood sugar that can be a complication of one of her home medicine called Invokana. Or she might be having some starvation ketoacidosis as she she was not eating because of nausea and vomiting.  She was given IV fluid, and was kept on sliding scale for insulin, her blood sugar never rises above 200. Her gap closed with IV fluid resuscitation, bicarbonate stays lower.  UTI. She had symptoms of urinary tract infection for more than a week. She was tested by her primary care physician on October 11 and found to have Klebsiella pneumoniae with good sensitivity. She was instructed to take antibiotics but she never picked her antibiotics as she was  traveling. She was given IV ceftriaxone for 2 days during hospital stay. She was discharged home on Augmentin for another 3 days.  DM. She has well-controlled diabetes, her last A1c was 6.4. On discharge she was instructed to continue her home regimen.  Hyperlipidemia. She is on simvastatin 20 mg daily and fenofibrate. We hold her medicines because of persistent nausea and vomiting during her hospital stay. We instructed her to restart on discharge.  Hypertension. She was on lisinopril 10 mg daily. We hold her lisinopril during her stay because of nausea and vomiting and softer blood pressure. She can resume on discharge.  Discharge Vitals:   BP 135/67 (BP Location: Left Arm)   Pulse (!) 108   Temp 98.9 F (37.2 C) (Oral)   Resp 19   SpO2 98%   Gen. thin lady, lying comfortably in bed, alert and oriented, in no acute distress. Abdomen. Soft, mildly tender epigastrium and left upper quadrant, nondistended, no guarding or rebound, bowel sounds positive. Lungs. Clear bilaterally CV. Regular rhythm with tachycardia. Extremities. No edema, no cyanosis, pulses 2+bilaterally  Pertinent Labs, Studies, and Procedures:  CBC Latest Ref Rng & Units 02/11/2016 02/10/2016  WBC 4.0 - 10.5 K/uL 14.7(H) 18.4(H)  Hemoglobin 12.0 - 15.0 g/dL 12.0 14.4  Hematocrit 36.0 - 46.0 % 36.6 42.5  Platelets 150 - 400 K/uL 273 323   CMP Latest Ref Rng & Units 02/11/2016 02/11/2016 02/10/2016  Glucose 65 - 99 mg/dL 153(H) 134(H) 191(H)  BUN 6 - 20 mg/dL 16 18 15   Creatinine 0.44 - 1.00 mg/dL 0.79 0.82 0.86  Sodium 135 - 145 mmol/L 138 139 140  Potassium 3.5 - 5.1 mmol/L 4.1 4.0 4.0  Chloride 101 - 111 mmol/L 111 111 108  CO2 22 - 32 mmol/L 16(L) 14(L) 15(L)  Calcium 8.9 - 10.3 mg/dL 9.0 8.9 9.3  Total Protein 6.5 - 8.1 g/dL - - -  Total Bilirubin 0.3 - 1.2 mg/dL - - -  Alkaline Phos 38 - 126 U/L - - -  AST 15 - 41 U/L - - -  ALT 14 - 54 U/L - - -   Urinalysis    Component Value Date/Time    COLORURINE AMBER (A) 02/10/2016 1300   APPEARANCEUR CLEAR 02/10/2016 1300   LABSPEC 1.021 02/10/2016 1300   PHURINE 5.0 02/10/2016 1300   GLUCOSEU >1000 (A) 02/10/2016 1300   HGBUR SMALL (A) 02/10/2016 1300   BILIRUBINUR NEGATIVE 02/10/2016 1300   KETONESUR >80 (A) 02/10/2016 1300   PROTEINUR NEGATIVE 02/10/2016 1300   NITRITE NEGATIVE 02/10/2016 1300   LEUKOCYTESUR TRACE (A) 02/10/2016 1300   Urine culture  Order: [id number]  Status:  Final result Visible to patient:  No (Not Released) Next appt:  None   2d ago  Specimen Description URINE, RANDOM   Special Requests NONE   Culture >=100,000 COLONIES/mL KLEBSIELLA PNEUMONIAE    Report Status 02/12/2016 FINAL   Organism ID, Bacteria KLEBSIELLA PNEUMONIAE    Resulting Agency [facility]  Susceptibility    Klebsiella pneumoniae   MIC  AMPICILLIN >=  32 RESIST... Resistant  AMPICILLIN/SULBACTAM 4 SENSITIVE  Sensitive  CEFAZOLIN <=4 SENSITIVE "><=4 SENSITIVE  Sensitive  CEFTRIAXONE <=1 SENSITIVE "><=1 SENSITIVE  Sensitive  CIPROFLOXACIN <=0.25 SENSITIVE "><=0.25 SENS... Sensitive  Extended ESBL NEGATIVE  Sensitive  GENTAMICIN <=1 SENSITIVE "><=1 SENSITIVE  Sensitive  IMIPENEM <=0.25 SENSITIVE "><=0.25 SENS... Sensitive  NITROFURANTOIN >=512 RESIS... Resistant  PIP/TAZO <=4 SENSITIVE "><=4 SENSITIVE  Sensitive  TRIMETH/SULFA <=20 SENSITIVE "><=20 SENSIT... Sensitive         EKG: Sinus tachycardia Atrial premature complex Borderline right axis deviation Borderline prolonged QT interval  CXR:  FINDINGS: The heart size and mediastinal contours are normal. The lungs are clear. There is no pleural effusion or pneumothorax. No acute osseous findings are identified. EKG snaps overlie the chest bilaterally. There are mild thoracic spine degenerative changes.  IMPRESSION: No active cardiopulmonary process.  DG Abdomen. FINDINGS: There is moderate stool throughout the colon and rectum. No bowel dilatation or evidence of  obstruction. No radiopaque calculi. No free air on the provided image. The osseous structures and soft tissues are grossly unremarkable.  IMPRESSION: No bowel obstruction.  Moderate colonic stool burden.  Discharge Instructions: Discharge Instructions    Diet - low sodium heart healthy    Complete by:  As directed    Discharge instructions    Complete by:  As directed    It was pleasure taking care of you. I'm giving you an antibiotic for your urinary tract infection cards Cipro floxacillin 500 mg to be taken once daily for 3 days. You can resume back on your home medicines. Please follow-up with your PCP.   Increase activity slowly    Complete by:  As directed       Signed: [name], MD 02/12/2016, 1:09 PM   Pager: [phone]

## 2016-02-11 NOTE — Discharge Instructions (Signed)
Take Augmentin twice daily for the next three days

## 2016-02-11 NOTE — Progress Notes (Signed)
   Subjective: Patient was feeling much better this morning. She was able to tolerate clear liquids. She said that her nausea and abdominal pain has been improved.  Objective:  Vital signs in last 24 hours: Vitals:   02/10/16 1839 02/10/16 2101 02/11/16 0453 02/11/16 1502  BP: 138/69 (!) 134/46 (!) 107/40 130/64  Pulse: (!) 124 (!) 128 (!) 108 (!) 110  Resp: 19 (!) 22 (!) 22 18  Temp: 98 F (36.7 C) 98.2 F (36.8 C) 98 F (36.7 C) 98.9 F (37.2 C)  TempSrc: Oral Oral Oral Oral  SpO2: 96% 97% 97% 98%   Gen. thin lady, lying comfortably in bed, alert and oriented, in no acute distress. Abdomen. Soft, mildly tender epigastrium and left upper quadrant, nondistended, no guarding or rebound, bowel sounds positive. Lungs. Clear bilaterally CV. Regular rhythm with tachycardia. Extremities. No edema, no cyanosis, pulses 2+bilaterally  Labs. BMP Latest Ref Rng & Units 02/11/2016 02/11/2016 02/10/2016  Glucose 65 - 99 mg/dL 153(H) 134(H) 191(H)  BUN 6 - 20 mg/dL 16 18 15   Creatinine 0.44 - 1.00 mg/dL 0.79 0.82 0.86  Sodium 135 - 145 mmol/L 138 139 140  Potassium 3.5 - 5.1 mmol/L 4.1 4.0 4.0  Chloride 101 - 111 mmol/L 111 111 108  CO2 22 - 32 mmol/L 16(L) 14(L) 15(L)  Calcium 8.9 - 10.3 mg/dL 9.0 8.9 9.3    Assessment/Plan:  Brandy Palmer,65 y.o lady with PMHOf diabetes complicated with gastroparesis, hypertension and tachycardia, came to the ED with complaints of nausea or vomiting and abdominal pain. Nausea vomiting with epigastric pain. Almost resolved, she still have mild epigastric discomfort without any nausea and vomiting and was able to tolerate food very well. She had metabolic acidosis with low bicarbonate and anion gap initially, her gap closed with fluid resuscitation. Bicarbonates are still low at 16. She can be discharged home with a close follow-up with her PCP in [place].  UTI. Her urine culture done yesterday in ED shows Klebsiella pneumonia, same as was shown by  another culture done on October 11 by her PCP. Sensitivity results are not back from yesterday's culture but we have them from her previous culture. She was given 2 doses of Rocephin. She can go home on Cipro 500 mg daily for another 3 days.  DM. She has well-controlled diabetes, her last A1c was 6.4. Her CBC remains between 104-109 during her stay. She was on sliding scale in the hospital. On discharge she continues EOMI her home regimen.  Hypertension. She remains mostly normotensive during her stay. She can resume her home dose of lisinopril on discharge.  Sinus tachycardia. Her heart rate stays between 100- 120. She followed up with a cardiologist back home in [place]. She can continued her home dose of metoprolol 25 mg daily on discharge.  Dispo: Being discharged today.[name], MD 02/11/2016, 4:27 PM Pager: [phone]

## 2016-02-12 LAB — URINE CULTURE

## 2016-02-15 ENCOUNTER — Other Ambulatory Visit (HOSPITAL_COMMUNITY): Payer: Self-pay

## 2018-09-21 IMAGING — CR DG ABD PORTABLE 1V
1 series · 1 of 1 positions shown · non-contrast
Comparison: None.

CLINICAL DATA: 65-year-old female with nausea vomiting

EXAM:
PORTABLE ABDOMEN - 1 VIEW

[AP]
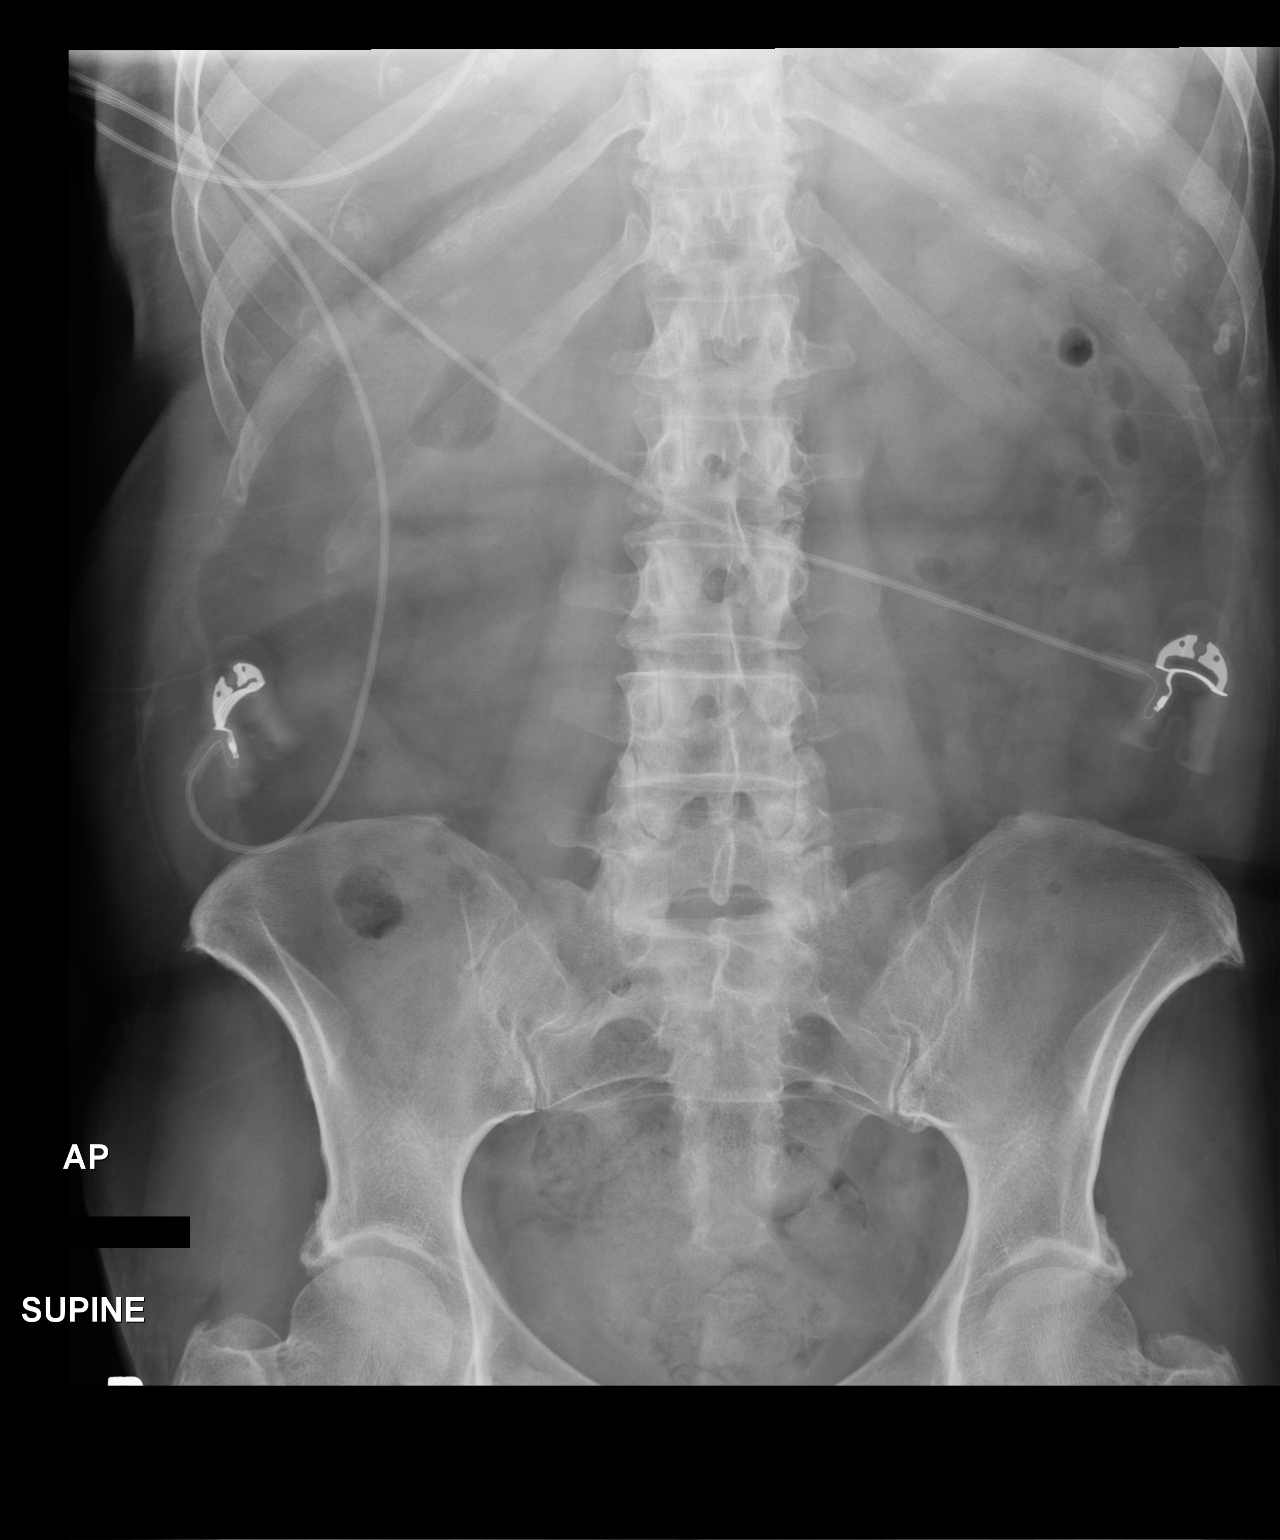

[1 of 1 positions shown; findings below may reference images not displayed]

FINDINGS: There is moderate stool throughout the colon and rectum. No bowel
dilatation or evidence of obstruction. No radiopaque calculi. No
free air on the provided image. The osseous structures and soft
tissues are grossly unremarkable.
IMPRESSION: No bowel obstruction.  Moderate colonic stool burden.
